# Patient Record
Sex: Male | Born: 1960 | Race: White | Hispanic: No | Marital: Single | Smoking: Former smoker
Health system: Southern US, Community
[De-identification: ages and names within clinical notes are randomized; demographics above are authoritative.]

## PROBLEM LIST (undated history)

## (undated) DIAGNOSIS — F1020 Alcohol dependence, uncomplicated: Secondary | ICD-10-CM

## (undated) DIAGNOSIS — S82899A Other fracture of unspecified lower leg, initial encounter for closed fracture: Secondary | ICD-10-CM

## (undated) DIAGNOSIS — K219 Gastro-esophageal reflux disease without esophagitis: Secondary | ICD-10-CM

## (undated) DIAGNOSIS — K4090 Unilateral inguinal hernia, without obstruction or gangrene, not specified as recurrent: Secondary | ICD-10-CM

## (undated) DIAGNOSIS — K562 Volvulus: Secondary | ICD-10-CM

## (undated) HISTORY — DX: Other fracture of unspecified lower leg, initial encounter for closed fracture: S82.899A

## (undated) HISTORY — PX: APPENDECTOMY: SHX54

## (undated) HISTORY — PX: COLON SURGERY: SHX602

## (undated) HISTORY — PX: ANKLE SURGERY: SHX546

---

## 2011-08-04 ENCOUNTER — Emergency Department: Payer: Self-pay | Admitting: Emergency Medicine

## 2012-09-21 IMAGING — CR DG CHEST 2V
1 series · 3 of 3 positions shown · non-contrast
Comparison: none

REASON FOR EXAM: right sided chest and rib pain
COMMENTS:   May transport without cardiac monitor

[Series 1: w chest pa · 0.14mm/px · 3 of 3 slices shown]
[im 1/3]
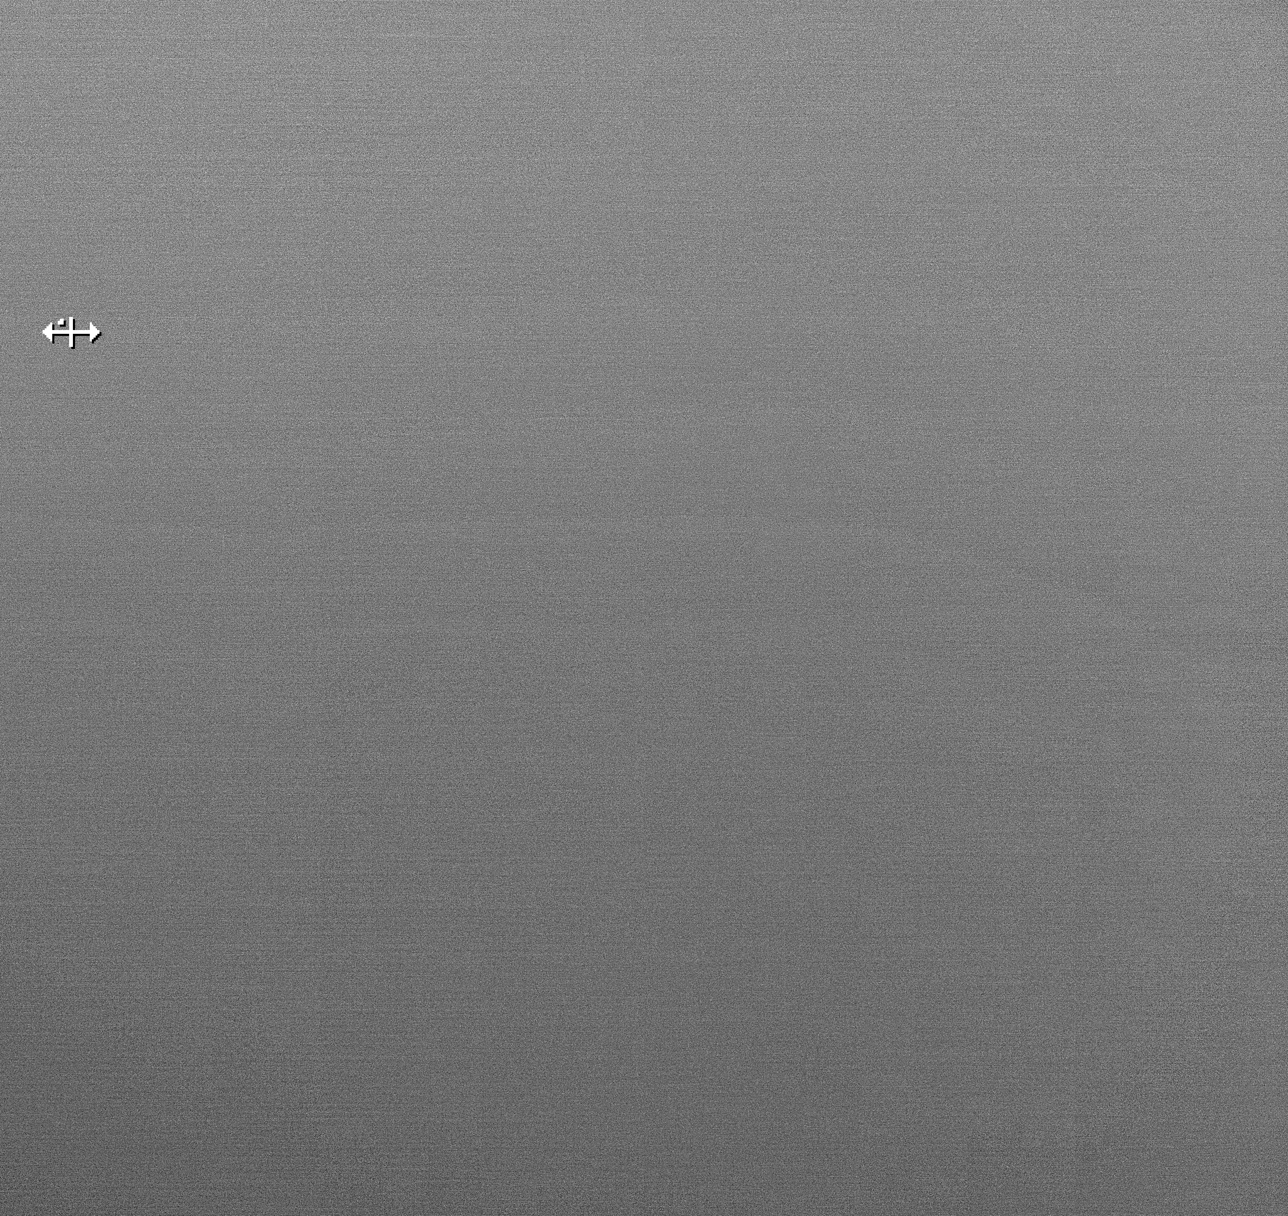
[im 2/3]
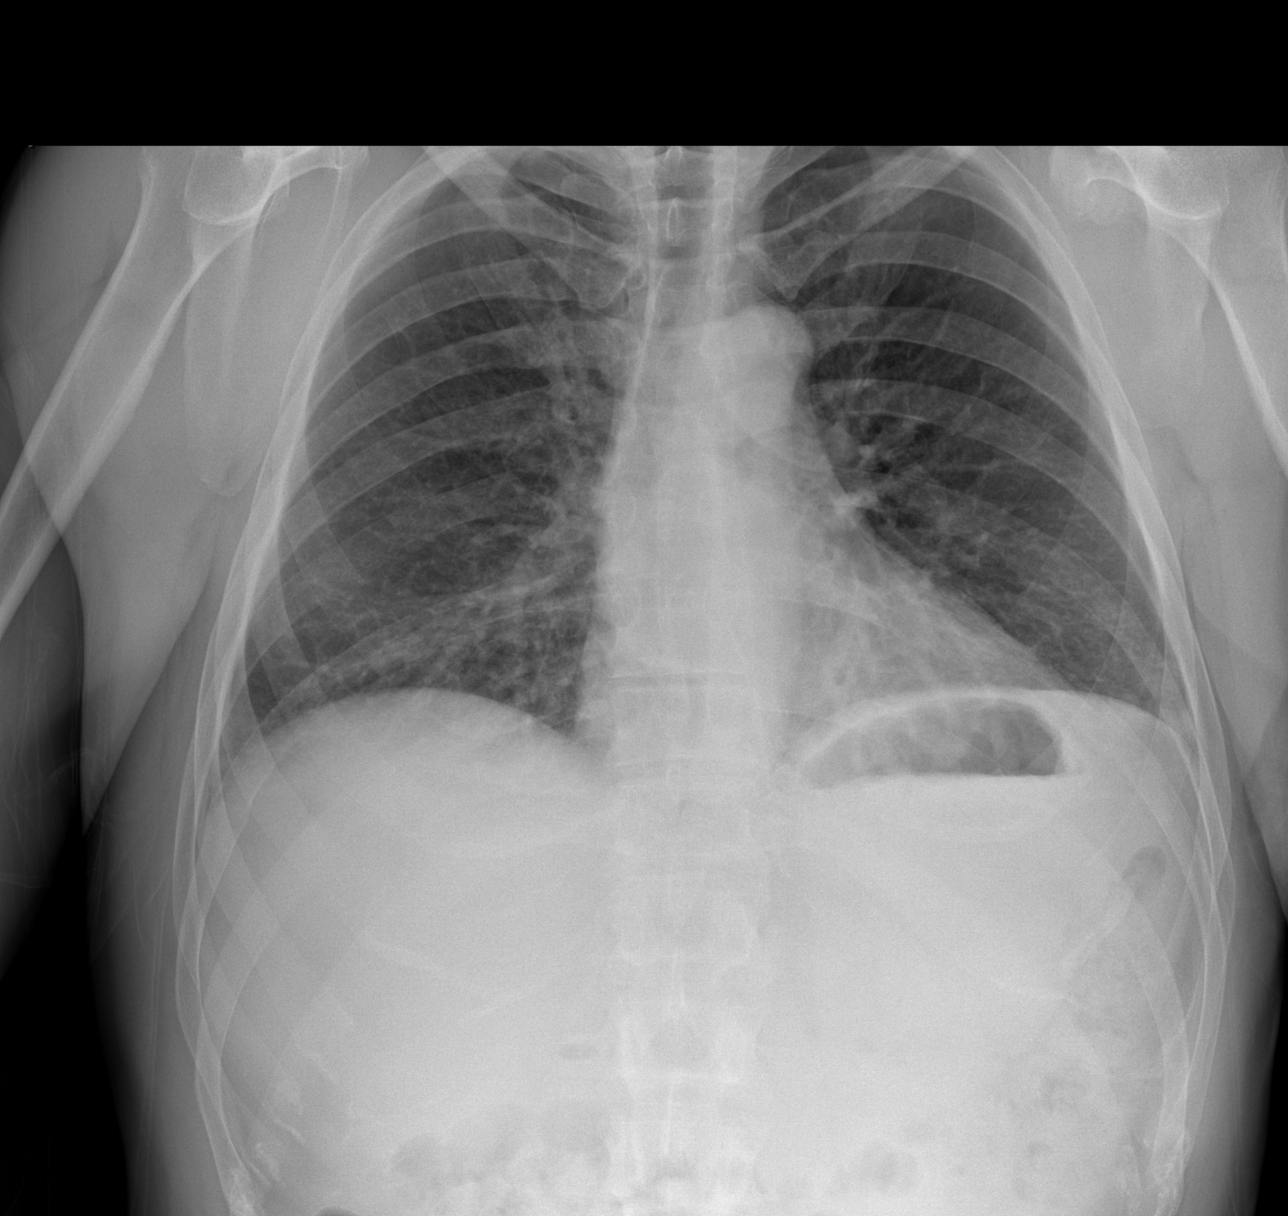
[im 3/3]
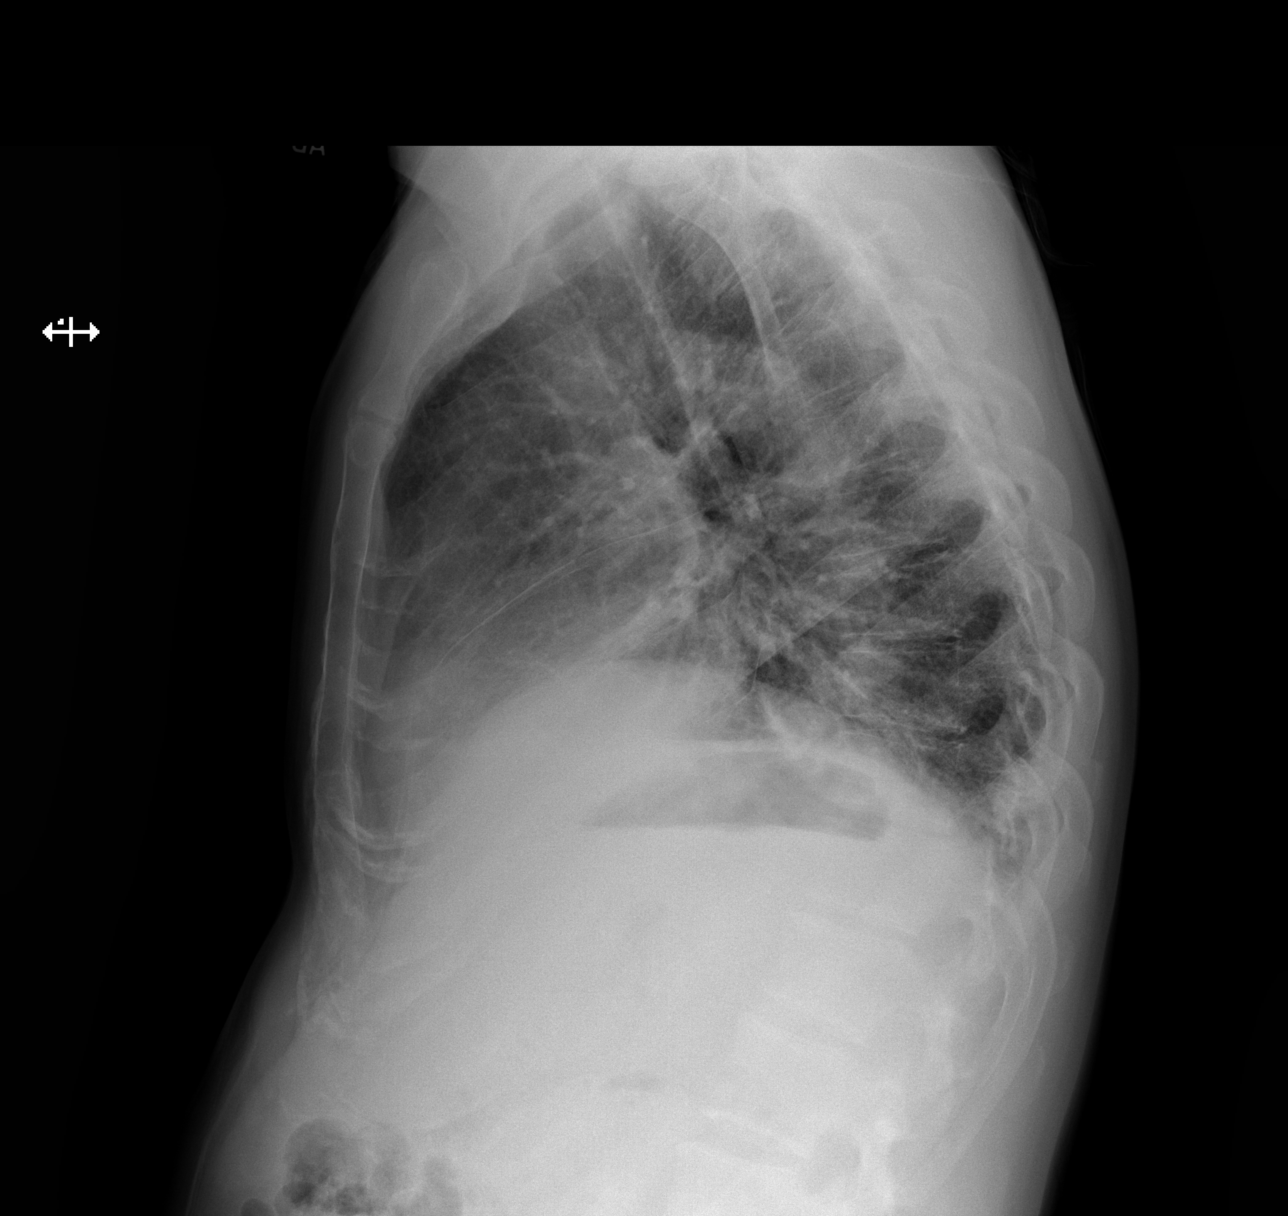

[3 of 3 positions shown; findings below may reference images not displayed]

PROCEDURE:     DXR - DXR CHEST PA (OR AP) AND LATERAL  - August 04, 2011 [DATE]

RESULT:     There is thickening of the lung markings bilaterally compatible
with the lack of the inspiration. Early interstitial pneumonia could produce
similar findings. Followup is recommended if such is clinically indicated.
The heart, mediastinal and osseous structures show no significant
abnormalities.
IMPRESSION: There is nonspecific thickening of the lung markings bilaterally, more
prominent on the right. The findings are likely due to the lack of deep
inspiration but early interstitial pneumonia cannot be totally excluded and
followup is recommended if clinically indicated.

## 2016-08-27 ENCOUNTER — Encounter (INDEPENDENT_AMBULATORY_CARE_PROVIDER_SITE_OTHER): Payer: Self-pay

## 2016-08-27 ENCOUNTER — Ambulatory Visit (INDEPENDENT_AMBULATORY_CARE_PROVIDER_SITE_OTHER): Payer: Managed Care, Other (non HMO) | Admitting: Internal Medicine

## 2016-08-27 ENCOUNTER — Encounter: Payer: Self-pay | Admitting: Internal Medicine

## 2016-08-27 VITALS — BP 132/80 | HR 77 | Ht 71.0 in | Wt 185.0 lb

## 2016-08-27 DIAGNOSIS — R21 Rash and other nonspecific skin eruption: Secondary | ICD-10-CM | POA: Diagnosis not present

## 2016-08-27 DIAGNOSIS — Z136 Encounter for screening for cardiovascular disorders: Secondary | ICD-10-CM

## 2016-08-27 DIAGNOSIS — Z79899 Other long term (current) drug therapy: Secondary | ICD-10-CM

## 2016-08-27 DIAGNOSIS — Z13220 Encounter for screening for lipoid disorders: Secondary | ICD-10-CM

## 2016-08-27 DIAGNOSIS — Z131 Encounter for screening for diabetes mellitus: Secondary | ICD-10-CM

## 2016-08-27 MED ORDER — TRIAMCINOLONE ACETONIDE 0.1 % EX OINT: 1.0000 "application " | TOPICAL_OINTMENT | Freq: Two times a day (BID) | CUTANEOUS | 0 refills | Status: AC

## 2016-08-27 NOTE — Patient Instructions (Addendum)
Medication Instructions:  Your physician has recommended you make the following change in your medication:  1- APPLY Triamcinolone ointment 0.1% twice a day to left lower leg for 1 week.   Labwork: Your physician recommends that you return for lab work in: TODAY (CMP, Lipid, Hemoglobin a1c).   Testing/Procedures: Your physician has requested that you have an abdominal aorta duplex. During this test, an ultrasound is used to evaluate the aorta. Allow 30 minutes for this exam. Do not eat after midnight the day before and avoid carbonated beverages   Follow-Up: Your physician recommends that you schedule a follow-up appointment in: 3 MONTHS WITH DR END.   You have been referred to DERMATOLOGY FOR EVALUATION OF RASH. Appointment scheduled with Dr Gwen PoundsKowalski on 10/13/16 at 0900. - Please arrive 15 minutes early to fill out paperwork. - Please bring your insurance card.    Dr End recommends you get knee high compression stockings 15-20 mmHg compression. You can find these over the counter at most drug stores or you can go to the Medical supply store of your choice.

## 2016-08-27 NOTE — Progress Notes (Signed)
New Outpatient Visit Date: 08/27/2016  Primary Care Provider: None  Chief Complaint: Left leg rash and swelling  HPI:  [name] is a 56 y.o. year-old male with no significant past medical history (though he has not been seen by a physician for man years), who presents for evaluation of a rash with swelling around the left ankle and cough.  He is accompanied by his daughter Anthony Maynard, who is a Engineer, site in our office.  [name] reports an itchy, scaly rash along the left calf for over a year.  It is not painful and bothers him most in the winter.  At time, he will develop small abrasions/bleeding with scratching.  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  After recurrent scratching of the rash, he also notes swelling of the left calf and ankle.  He notes prominent veins in both ankles, which have been present for some time.  He is also concerned about a red, scaly area along the outside of his right ear and on his scalp.  He has not seen a primary care physician or dermatologist.  He has applied moisturizing lotion and Neosporin to the left leg rash without significant improvement.  [name]'s daughter is also concerned about the possibility of a AAA, since the patient's father died with complication from a AAA repair.  [name] denies abdominal and back pain but endorses occasional cramps in his legs (especially at night).  He has a history of tobacco use but stopped about 5 years ago.  He does not have other establish cardiovascular disease.  He was evaluated in 2013 for atypical chest pain with EKG and CXR.  The pain was sharp and located along the lower part of his ribs/chest.  It has not recurred since that time.  He denies shortness of breath, palpitations, lightheadedness, orthopnea, and PND.  He does not sleep particularly well at night, though, nor does he feel  well-rested in the morning.  --------------------------------------------------------------------------------------------------  Cardiovascular History & Procedures: Cardiovascular Problems:  Atypical chest pain  Leg craps  Risk Factors:  Male gender, age > 29, and history of tobacco use  Cath/PCI:  None  CV Surgery:  None  EP Procedures and Devices:  None  Non-Invasive Evaluation(s):  None  Recent CV Pertinent Labs: Lab Results  Component Value Date   K 4.1 08/27/2016   BUN 8 08/27/2016   CREATININE 0.78 08/27/2016    --------------------------------------------------------------------------------------------------  Past Medical History:  Diagnosis Date  . Ankle fracture    Right    Past Surgical History:  Procedure Laterality Date  . ANKLE SURGERY     right ankle     Outpatient Encounter Prescriptions as of 08/27/2016  Medication Sig  . b complex vitamins tablet Take 1 tablet by mouth daily.  Marland Kitchen triamcinolone ointment (KENALOG) 0.1 % Apply 1 application topically 2 (two) times daily. To left lower extremity rash, for 1 week.   No facility-administered encounter medications on file as of 08/27/2016.     Allergies: Patient has no known allergies.  Social History   Social History  . Marital status: Single    Spouse name: N/A  . Number of children: N/A  . Years of education: N/A   Occupational History  . Not on file.   Social History Main Topics  . Smoking status: Former Smoker  Packs/day: 1.00    Years: 15.00    Types: Cigarettes    Quit date: 2013  . Smokeless tobacco: Never Used  . Alcohol use 10.8 oz/week    18 Cans of beer per week  . Drug use: No  . Sexual activity: Not on file   Other Topics Concern  . Not on file   Social History Narrative  . No narrative on file    Family History  Problem Relation Age of Onset  . Aneurysm Mother 2066    Brain aneurysm   . Chronic fatigue Mother   . AAA (abdominal aortic aneurysm)  Father 168  . Lung cancer Father     Review of Systems: A 12-system review of systems was performed and was negative except as noted in the HPI.  --------------------------------------------------------------------------------------------------  Physical Exam: BP 132/80 (BP Location: Right Arm, Patient Position: Sitting, Cuff Size: Normal)   Pulse 77   Ht 5\' 11"  (1.803 m)   Wt 185 lb (83.9 kg)   BMI 25.80 kg/m   General:  Well-developed, well-nourished man, seated comfortable on the exam table.  He is accompanied by his daughter. HEENT: No conjunctival pallor or scleral icterus.  Moist mucous membranes.  OP clear. Neck: Supple without lymphadenopathy, thyromegaly, JVD, or HJR.  No carotid bruit. Lungs: Normal work of breathing.  Clear to auscultation bilaterally without wheezes or crackles. Heart: Regular rate and rhythm without murmurs, rubs, or gallops.  Non-displaced PMI. Abd: Bowel sounds present.  Soft, NT/ND without hepatosplenomegaly Ext: Trace calf and 1+ ankle edema (posterior to the medial malleoli) bilaterally.  Radial, PT, and DP pulses are 2+ bilaterally Skin: Left lateral and anterior calf demonstrates scaly, erythematous rash with areas of excoriation.  Right ear helix with small, red but somewhat pearly papule.  Scaling noted along scalp with excoriations. Neuro: CNIII-XII intact.  Strength and fine-touch sensation intact in upper and lower extremities bilaterally. Psych: Normal mood and affect.  EKG:  Normal sinus rhythm without significant abnormalities.  No significant change compared with prior tracing from 08/04/11 (I have personally reviewed both tracings).   Lab Results  Component Value Date   NA 140 08/27/2016   K 4.1 08/27/2016   CL 95 (L) 08/27/2016   CO2 25 08/27/2016   BUN 8 08/27/2016   CREATININE 0.78 08/27/2016   GLUCOSE 94 08/27/2016   ALT 24 08/27/2016     --------------------------------------------------------------------------------------------------  ASSESSMENT AND PLAN: Left leg rash Findings are most suggestive of venous stasis dermatitis, though the lateral calf location is somewhat atypical.  Pedal pulses are brisk without foot ulcers; I therefore have a low suspicion for arterial insufficiency.  We will try triamcinolone 0.1% ointment applied to the left leg BID x 1 week.  I will also have the patient wear compression stockings to help with venous insufficiency.  We will refer him to dermatology for further evaluation.  Right ear rash Entire ear is somewhat red, though a small, somewhat pearly papule is noticeable.  This raises the possibility of basal cell carcinoma.  I have referred the patient to dermatology for further evaluation.  Screening for AAA Patient has a history of tobacco use and family history of AAA.  He has occasional LE cramps, which are non-specific.  His pedal pulses are brisk.  However, given his risk factors, we will obtain an ultrasound for further evaluation of the aorta.  Cardiovascular screening Given the patient's age and lack of medical care for many years, we will obtain routine, labs including CMP,  hemoglobin A1c, and lipid panel.  He will need to establish with a PCP as well for continued primary care needs.  Follow-up: Return to clinic in 3 months.  Yvonne Kendall, MD 08/28/2016 7:31 PM

## 2016-08-28 ENCOUNTER — Encounter: Payer: Self-pay | Admitting: Internal Medicine

## 2016-08-28 ENCOUNTER — Telehealth: Payer: Self-pay | Admitting: *Deleted

## 2016-08-28 LAB — COMPREHENSIVE METABOLIC PANEL
ALK PHOS: 92 IU/L (ref 39–117)
ALT: 24 IU/L (ref 0–44)
AST: 25 IU/L (ref 0–40)
Albumin/Globulin Ratio: 1.9 (ref 1.2–2.2)
Albumin: 4.7 g/dL (ref 3.5–5.5)
BILIRUBIN TOTAL: 1.2 mg/dL (ref 0.0–1.2)
BUN/Creatinine Ratio: 10 (ref 9–20)
BUN: 8 mg/dL (ref 6–24)
CHLORIDE: 95 mmol/L — AB (ref 96–106)
CO2: 25 mmol/L (ref 18–29)
Calcium: 9.7 mg/dL (ref 8.7–10.2)
Creatinine, Ser: 0.78 mg/dL (ref 0.76–1.27)
GFR calc Af Amer: 117 mL/min/{1.73_m2} (ref >59)
GFR calc non Af Amer: 102 mL/min/{1.73_m2} (ref >59)
Globulin, Total: 2.5 g/dL (ref 1.5–4.5)
Glucose: 94 mg/dL (ref 65–99)
Potassium: 4.1 mmol/L (ref 3.5–5.2)
Sodium: 140 mmol/L (ref 134–144)
Total Protein: 7.2 g/dL (ref 6.0–8.5)

## 2016-08-28 LAB — HEMOGLOBIN A1C
Est. average glucose Bld gHb Est-mCnc: 100 mg/dL
HEMOGLOBIN A1C: 5.1 % (ref 4.8–5.6)

## 2016-08-28 NOTE — Telephone Encounter (Signed)
Patient had lab work drawn at office visit yesterday. Lipid panel was ordered but not processed. S/w [facility] and they said Lipids can be added to blood work. This will process later on today.

## 2016-08-28 NOTE — Telephone Encounter (Signed)
S/w patient's daughter, ok per DPR, concerning referral to dermatology.  Daughter scheduled appt with [facility] [facility] per patient preference in February.  Called [facility] and cancelled patient's appt at that locations. Daughter, [place], notified that appt was cancelled.

## 2016-08-29 NOTE — Telephone Encounter (Signed)
Received written authorization request to add on Lipid panel to patient's lab work. Signed by Onnie BoerJennifer Clark, RN and faxed back to [facility].  Lipid panel had resulted in EPIC.

## 2016-08-30 LAB — LIPID PANEL
CHOLESTEROL TOTAL: 205 mg/dL — AB (ref 100–199)
Chol/HDL Ratio: 2.6 ratio units (ref 0.0–5.0)
HDL: 79 mg/dL (ref >39)
LDL Calculated: 95 mg/dL (ref 0–99)
Triglycerides: 157 mg/dL — ABNORMAL HIGH (ref 0–149)
VLDL CHOLESTEROL CAL: 31 mg/dL (ref 5–40)

## 2016-08-30 LAB — SPECIMEN STATUS REPORT

## 2016-09-08 ENCOUNTER — Other Ambulatory Visit: Payer: Self-pay | Admitting: Internal Medicine

## 2016-09-08 DIAGNOSIS — Z136 Encounter for screening for cardiovascular disorders: Secondary | ICD-10-CM

## 2016-09-08 DIAGNOSIS — Z8249 Family history of ischemic heart disease and other diseases of the circulatory system: Secondary | ICD-10-CM

## 2016-09-22 ENCOUNTER — Ambulatory Visit: Payer: Managed Care, Other (non HMO)

## 2016-09-22 DIAGNOSIS — Z8249 Family history of ischemic heart disease and other diseases of the circulatory system: Secondary | ICD-10-CM

## 2016-09-22 DIAGNOSIS — Z136 Encounter for screening for cardiovascular disorders: Secondary | ICD-10-CM

## 2016-09-22 DIAGNOSIS — I700 Atherosclerosis of aorta: Secondary | ICD-10-CM | POA: Diagnosis not present

## 2016-11-17 ENCOUNTER — Telehealth: Payer: Self-pay | Admitting: *Deleted

## 2016-11-17 NOTE — Telephone Encounter (Signed)
That is fine 

## 2016-11-17 NOTE — Telephone Encounter (Signed)
Patient's daughter listed on DPR stated patient wants to cancel his appt for tomorrow and follow up in 6 months instead of 3 months.  Advised her that Dr Serita Kyle last OV stated for patient to f/u in 3 months and will route to Dr End to advise if a sooner appt is necessary.  Patient is out of town tomorrow as well.  Appt cancelled. Routing to Dr End.

## 2016-11-18 ENCOUNTER — Ambulatory Visit: Payer: Managed Care, Other (non HMO) | Admitting: Internal Medicine

## 2018-12-03 ENCOUNTER — Telehealth: Payer: Self-pay

## 2018-12-03 NOTE — Telephone Encounter (Signed)
Attempt to call patient 3 times per recall list.  No answer. No call back.  Will delete recall.

## 2022-03-18 ENCOUNTER — Emergency Department: Payer: Commercial Managed Care - PPO

## 2022-03-18 ENCOUNTER — Other Ambulatory Visit: Payer: Self-pay

## 2022-03-18 ENCOUNTER — Encounter: Payer: Self-pay | Admitting: Emergency Medicine

## 2022-03-18 ENCOUNTER — Emergency Department
Admission: EM | Admit: 2022-03-18 | Discharge: 2022-03-18 | Disposition: A | Discharge status: Home / Self Care | Payer: Commercial Managed Care - PPO | Attending: Emergency Medicine | Admitting: Emergency Medicine

## 2022-03-18 DIAGNOSIS — K4090 Unilateral inguinal hernia, without obstruction or gangrene, not specified as recurrent: Secondary | ICD-10-CM | POA: Diagnosis not present

## 2022-03-18 DIAGNOSIS — R7989 Other specified abnormal findings of blood chemistry: Secondary | ICD-10-CM | POA: Insufficient documentation

## 2022-03-18 DIAGNOSIS — R1903 Right lower quadrant abdominal swelling, mass and lump: Secondary | ICD-10-CM | POA: Diagnosis present

## 2022-03-18 LAB — HEPATIC FUNCTION PANEL
ALT: 56 U/L — ABNORMAL HIGH (ref 0–44)
AST: 62 U/L — ABNORMAL HIGH (ref 15–41)
Albumin: 4.1 g/dL (ref 3.5–5.0)
Alkaline Phosphatase: 74 U/L (ref 38–126)
Bilirubin, Direct: 0.3 mg/dL — ABNORMAL HIGH (ref 0.0–0.2)
Indirect Bilirubin: 0.6 mg/dL (ref 0.3–0.9)
Total Bilirubin: 0.9 mg/dL (ref 0.3–1.2)
Total Protein: 7.2 g/dL (ref 6.5–8.1)

## 2022-03-18 LAB — CBC
HCT: 42.1 % (ref 39.0–52.0)
Hemoglobin: 14.4 g/dL (ref 13.0–17.0)
MCH: 34.6 pg — ABNORMAL HIGH (ref 26.0–34.0)
MCHC: 34.2 g/dL (ref 30.0–36.0)
MCV: 101.2 fL — ABNORMAL HIGH (ref 80.0–100.0)
Platelets: 192 K/uL (ref 150–400)
RBC: 4.16 MIL/uL — ABNORMAL LOW (ref 4.22–5.81)
RDW: 11.9 % (ref 11.5–15.5)
WBC: 7.3 K/uL (ref 4.0–10.5)
nRBC: 0 % (ref 0.0–0.2)

## 2022-03-18 LAB — BASIC METABOLIC PANEL
Anion gap: 12 (ref 5–15)
BUN: 9 mg/dL (ref 8–23)
CO2: 24 mmol/L (ref 22–32)
Calcium: 9.1 mg/dL (ref 8.9–10.3)
Chloride: 97 mmol/L — ABNORMAL LOW (ref 98–111)
Creatinine, Ser: 0.67 mg/dL (ref 0.61–1.24)
GFR, Estimated: >60 mL/min (ref >60)
Glucose, Bld: 156 mg/dL — ABNORMAL HIGH (ref 70–99)
Potassium: 3.8 mmol/L (ref 3.5–5.1)
Sodium: 133 mmol/L — ABNORMAL LOW (ref 135–145)

## 2022-03-18 LAB — LIPASE, BLOOD: Lipase: 59 U/L — ABNORMAL HIGH (ref 11–51)

## 2022-03-18 LAB — LACTIC ACID, PLASMA: Lactic Acid, Venous: 2.2 mmol/L (ref 0.5–1.9)

## 2022-03-18 MED ORDER — MORPHINE SULFATE (PF) 4 MG/ML IV SOLN
4.0000 mg | Freq: Once | INTRAVENOUS | Status: AC
  Administered 2022-03-18: 4 mg via INTRAVENOUS
  Filled 2022-03-18: qty 1

## 2022-03-18 MED ORDER — IOHEXOL 300 MG/ML  SOLN
100.0000 mL | Freq: Once | INTRAMUSCULAR | Status: AC | PRN
  Administered 2022-03-18: 100 mL via INTRAVENOUS

## 2022-03-18 MED ORDER — DIAZEPAM 5 MG PO TABS
10.0000 mg | ORAL_TABLET | Freq: Once | ORAL | Status: AC
  Administered 2022-03-18: 10 mg via ORAL
  Filled 2022-03-18: qty 2

## 2022-03-18 NOTE — Discharge Instructions (Signed)
Return to the ER if you have a hernia has not been able to be pushed back in with ice or develop any fevers vomiting worsening abdominal pain or any other concerns.  Please call surgery to schedule an outpatient appointment for outpatient surgery  IMPRESSION: Large right inguinal hernia is noted which contains a portion of cecum and terminal ileum, and appears to be resulting in at least partial obstruction of more proximal small bowel loops.   Hepatic steatosis.   Sigmoid diverticulosis without inflammation.   Aortic Atherosclerosis (ICD10-I70.0).

## 2022-03-18 NOTE — ED Notes (Signed)
ED Provider at bedside. 

## 2022-03-18 NOTE — ED Provider Notes (Addendum)
5:45 PM re-evaluated pt. I had placed an ice pack over the right groin area when I went to reevaluate the area and it appears that it has reduced.  Given the frequency of this I still recommended discussing with surgery.  Dr. Tonna Boehringer is currently in surgery but will come down to evaluate patient after surgery.   8:21 PM  he was seen by surgery.  Considered admission but given patient's hernia has reduced and patient has been seen by surgery and is remained reduced patient has resolution of symptoms surgery with prefer to do this outpatient is recommending discharge.  I reevaluated patient and he agrees that he would want other it be done outpatient.  He continues to have no pain.  I recommended a repeat lactate to make sure there is no evidence of bowel damage and the risk for intestinal necrosis and death but patient does not want to wait any longer and is declining repeat lactate.  He understands to return if his symptoms are worsening or develops any other new concerns or if he has recurrence of herniation that he is unable to reduce.  He expressed understanding and wife is there to witness this conversation but at this time he did not want to stay for any additional blood work.  He has capacity to make this decision at this time is electing to be discharged home  Patient noted to have some hepatic steatosis on his CT scan and slight elevation of his liver test.  Recommended patient cut down on his alcohol use but we discussed the dangers of complete cessation.  He expressed understanding.  I discussed the provisional nature of ED diagnosis, the treatment so far, the ongoing plan of care, follow up appointments and return precautions with the patient and any family or support people present. They expressed understanding and agreed with the plan, discharged home.      Concha Se, MD 03/18/22 2023    Concha Se, MD 03/18/22 2024

## 2022-03-18 NOTE — ED Notes (Signed)
Lab made aware of add-on labwork.

## 2022-03-18 NOTE — ED Triage Notes (Signed)
Pt via POV from home. Pt has a umbilical hernia that started in umbilicus and down to his his RLQ, states it is soft but painful, states he has been pushing bag in and he could not push it back in this time. Pt is A&Ox4 and NAD.

## 2022-03-18 NOTE — ED Notes (Signed)
Ice pack placed on hernia by [employer].

## 2022-03-18 NOTE — ED Provider Notes (Signed)
[facility] Provider Note    Event Date/Time   First MD Initiated Contact with Patient 03/18/22 1439     (approximate)   History   Hernia   HPI  Anthony Maynard is a 61 y.o. male   Past medical history of no past medical history, though the patient states that he does not visit doctors. Presents with mass in the right groin that is extremely painful. He states that he has had an umbilical hernia that is reducible and has been stable for many years.  Over the last several weeks he noticed a bulge that slips into his right groin that is reducible but this morning it became firm severely tender and irreducible.  His last bowel movement was a small 1 earlier this morning.  He has no nausea or vomiting.  No other complaints.  History was obtained via the patient and his partner who is at bedside.      Physical Exam   Triage Vital Signs: ED Triage Vitals  Enc Vitals Group     BP 03/18/22 1311 129/75     Pulse Rate 03/18/22 1311 (!) 20     Resp 03/18/22 1311 18     Temp 03/18/22 1311 98.2 F (36.8 C)     Temp src --      SpO2 03/18/22 1311 98 %     Weight 03/18/22 1518 150 lb (68 kg)     Height 03/18/22 1308 5\' 11"  (1.803 m)     Head Circumference --      Peak Flow --      Pain Score 03/18/22 1308 10     Pain Loc --      Pain Edu? --      Excl. in GC? --     Most recent vital signs: Vitals:   03/18/22 1311 03/18/22 1510  BP: 129/75 134/82  Pulse: (!) 20 78  Resp: 18 18  Temp: 98.2 F (36.8 C)   SpO2: 98% 100%    General: Awake, alert, appears to be in pain. CV:  Good peripheral perfusion.  Resp:  Normal effort.  Breath sounds clear. Abd:  No distention.  Nontender to palpation except for a firm irreducible mass in the right inguinal area, no overlying skin changes.  He does have an umbilical hernia that is reducible and nontender. Other:  No signs of acute alcohol withdrawal, no obvious tremors or tongue fasciculation, hemodynamics  no tachycardia not hypertensive.   ED Results / Procedures / Treatments   Labs (all labs ordered are listed, but only abnormal results are displayed) Labs Reviewed  CBC - Abnormal; Notable for the following components:      Result Value   RBC 4.16 (*)    MCV 101.2 (*)    MCH 34.6 (*)    All other components within normal limits  BASIC METABOLIC PANEL - Abnormal; Notable for the following components:   Sodium 133 (*)    Chloride 97 (*)    Glucose, Bld 156 (*)    All other components within normal limits  HEPATIC FUNCTION PANEL - Abnormal; Notable for the following components:   AST 62 (*)    ALT 56 (*)    Bilirubin, Direct 0.3 (*)    All other components within normal limits  LIPASE, BLOOD - Abnormal; Notable for the following components:   Lipase 59 (*)    All other components within normal limits  LACTIC ACID, PLASMA - Abnormal; Notable for the following  components:   Lactic Acid, Venous 2.2 (*)    All other components within normal limits  LACTIC ACID, PLASMA     I reviewed labs and they are notable for no leukocytosis.  Mildly elevated lactic at 2.2.  Mildly elevated LFTs.     RADIOLOGY I dependently reviewed and interpreted CT scan of the abdomen and pelvis pending by the time of my signout in this note.   PROCEDURES:  Critical Care performed: No  Procedures   MEDICATIONS ORDERED IN ED: Medications  morphine (PF) 4 MG/ML injection 4 mg (4 mg Intravenous Given 03/18/22 1510)  iohexol (OMNIPAQUE) 300 MG/ML solution 100 mL (100 mLs Intravenous Contrast Given 03/18/22 1527)  diazepam (VALIUM) tablet 10 mg (10 mg Oral Given 03/18/22 1545)    IMPRESSION / MDM / ASSESSMENT AND PLAN / ED COURSE  I reviewed the triage vital signs and the nursing notes.                              Differential diagnosis includes, but is not limited to, strangulated/incarcerated hernia, obstruction.  Considered other causes of groin pain / abdominal pain like appendicitis, urine  infection, ischemia but less likely in this patient who has obvious mass in the area of pain.   MDM: This patient with a inguinal hernia to the right side that is firm and irreducible concerning for incarcerated hernia.  Getting CT scan, labs, pain control.  The patient's spouse notes that this patient drinks daily.  There is no signs of alcohol withdrawal at this time.  Given 10 mg of Valium anxiety, continue to monitor for signs of alcohol withdrawal.   Patient's presentation is most consistent with acute presentation with potential threat to life or bodily function.       FINAL CLINICAL IMPRESSION(S) / ED DIAGNOSES   Final diagnoses:  Inguinal hernia, right     Rx / DC Orders   ED Discharge Orders     None        Note:  This document was prepared using Dragon voice recognition software and may include unintentional dictation errors.    Pilar Jarvis, MD 03/18/22 [phone]

## 2022-03-18 NOTE — Consult Note (Signed)
Subjective:   CC: Right groin hernia  HPI:  Anthony Maynard is a 61 y.o. male who was referred by [facility] for evaluation of above cc.   Symptoms were first noted  today.   Pain was severe until hernia spontaneously reduced.  Associated with nothing specific, exacerbated by exertion/ lump is reducible.     Past Medical History:  has a past medical history of Ankle fracture.  Past Surgical History:  Past Surgical History:  Procedure Laterality Date   ANKLE SURGERY     right ankle     Family History: family history includes AAA (abdominal aortic aneurysm) (age of onset: 84) in his father; Aneurysm (age of onset: 74) in his mother; Chronic fatigue in his mother; Lung cancer in his father.  Social History:  reports that he quit smoking about 10 years ago. His smoking use included cigarettes. He has a 15.00 pack-year smoking history. He has never used smokeless tobacco. He reports current alcohol use of about 18.0 standard drinks of alcohol per week. He reports that he does not use drugs.  Current Medications:  Prior to Admission medications   Medication Sig Start Date End Date Taking? Authorizing Provider  b complex vitamins tablet Take 1 tablet by mouth daily.    [provider]    Allergies:  Allergies as of 03/18/2022   (No Known Allergies)    ROS:  General: Denies weight loss, weight gain, fatigue, fevers, chills, and night sweats. Eyes: Denies blurry vision, double vision, eye pain, itchy eyes, and tearing. Ears: Denies hearing loss, earache, and ringing in ears. Nose: Denies sinus pain, congestion, infections, runny nose, and nosebleeds. Mouth/throat: Denies hoarseness, sore throat, bleeding gums, and difficulty swallowing. Heart: Denies chest pain, palpitations, racing heart, irregular heartbeat, leg pain or swelling, and decreased activity tolerance. Respiratory: Denies breathing difficulty, shortness of breath, wheezing, cough, and sputum. GI: Denies change in  appetite, heartburn, nausea, vomiting, constipation, diarrhea, and blood in stool. GU: Denies difficulty urinating, pain with urinating, urgency, frequency, blood in urine. Musculoskeletal: Denies joint stiffness, pain, swelling, muscle weakness. Skin: Denies rash, itching, mass, tumors, sores, and boils Neurologic: Denies headache, fainting, dizziness, seizures, numbness, and tingling. Psychiatric: Denies depression, anxiety, difficulty sleeping, and memory loss. Endocrine: Denies heat or cold intolerance, and increased thirst or urination. Blood/lymph: Denies easy bruising, easy bruising, and swollen glands     Objective:     BP (!) 140/87   Pulse 73   Temp 98.4 F (36.9 C) (Oral)   Resp 12   Ht 5\' 11"  (1.803 m)   Wt 68 kg   SpO2 97%   BMI 20.92 kg/m   Constitutional :  alert, cooperative, appears stated age, and no distress  Lymphatics/Throat:  no asymmetry, masses, or scars  Respiratory:  clear to auscultation bilaterally  Cardiovascular:  regular rate and rhythm  Gastrointestinal: soft, non-tender; bowel sounds normal; no masses,  no organomegaly.  Right inguinal hernia defect palpable with no hernia contents at this time.  Umbilical hernia also noted.  Musculoskeletal: lying in bed, no obvious difficulty moving upper extremities  Skin: Cool and moist  Psychiatric: Normal affect, non-agitated, not confused       LABS:     Latest Ref Rng & Units 03/18/2022    1:09 PM 08/27/2016    3:44 PM  CMP  Glucose 70 - 99 mg/dL 08/29/2016  94   BUN 8 - 23 mg/dL 9  8   Creatinine 161 - 1.24 mg/dL 0.96  0.45  Sodium 135 - 145 mmol/L 133  140   Potassium 3.5 - 5.1 mmol/L 3.8  4.1   Chloride 98 - 111 mmol/L 97  95   CO2 22 - 32 mmol/L 24  25   Calcium 8.9 - 10.3 mg/dL 9.1  9.7   Total Protein 6.5 - 8.1 g/dL 7.2  7.2   Total Bilirubin 0.3 - 1.2 mg/dL 0.9  1.2   Alkaline Phos 38 - 126 U/L 74  92   AST 15 - 41 U/L 62  25   ALT 0 - 44 U/L 56  24       Latest Ref Rng & Units 03/18/2022     1:09 PM  CBC  WBC 4.0 - 10.5 K/uL 7.3   Hemoglobin 13.0 - 17.0 g/dL 25.0   Hematocrit 03.7 - 52.0 % 42.1   Platelets 150 - 400 K/uL 192     RADS: N/a Assessment:     Right inguinal hernia Plan:    Discussed the risk of surgery including recurrence, which can be up to 50% in the case of incisional or complex hernias, possible use of prosthetic materials (mesh) and the increased risk of mesh infxn if used, bleeding, chronic pain, post-op infxn, post-op SBO or ileus, and possible re-operation to address said risks. The risks of general anesthetic, if used, includes MI, CVA, sudden death or even reaction to anesthetic medications also discussed. Alternatives include continued observation.  Benefits include possible symptom relief, prevention of incarceration, strangulation, enlargement in size over time, and the risk of emergency surgery in the face of strangulation.  Typical post-op recovery time of 3-5 days with 4-6 weeks of activity restrictions were also discussed.  The patient verbalized understanding and all questions were answered to the patient's satisfaction.  Due to successful reduction of hernia contents higher to consultation, will schedule elective hernia repair.  Robotic assisted laparoscopic.  We will fix the incidental umbilical hernia at the same time.  Strict ED return precautions discussed in case this recurs.   labs/images/medications/previous chart entries reviewed personally and relevant changes/updates noted above.

## 2022-03-21 ENCOUNTER — Ambulatory Visit: Payer: Self-pay | Admitting: Surgery

## 2022-03-21 ENCOUNTER — Encounter
Admission: RE | Admit: 2022-03-21 | Discharge: 2022-03-21 | Disposition: A | Discharge status: Home / Self Care | Payer: 59 | Source: Ambulatory Visit | Attending: Surgery | Admitting: Surgery

## 2022-03-21 HISTORY — DX: Unilateral inguinal hernia, without obstruction or gangrene, not specified as recurrent: K40.90

## 2022-03-21 HISTORY — DX: Alcohol dependence, uncomplicated: F10.20

## 2022-03-21 HISTORY — DX: Volvulus: K56.2

## 2022-03-21 HISTORY — DX: Gastro-esophageal reflux disease without esophagitis: K21.9

## 2022-03-21 NOTE — H&P (Signed)
Subjective:   CC: Right groin hernia  HPI:  Anthony Maynard is a 61 y.o. male who was referred by [facility] for evaluation of above cc.   Symptoms were first noted  today.   Pain was severe until hernia spontaneously reduced.  Associated with nothing specific, exacerbated by exertion/ lump is reducible.     Past Medical History:  has a past medical history of Ankle fracture.  Past Surgical History:  Past Surgical History:  Procedure Laterality Date   ANKLE SURGERY     right ankle     Family History: family history includes AAA (abdominal aortic aneurysm) (age of onset: 68) in his father; Aneurysm (age of onset: 66) in his mother; Chronic fatigue in his mother; Lung cancer in his father.  Social History:  reports that he quit smoking about 10 years ago. His smoking use included cigarettes. He has a 15.00 pack-year smoking history. He has never used smokeless tobacco. He reports current alcohol use of about 18.0 standard drinks of alcohol per week. He reports that he does not use drugs.  Current Medications:  Prior to Admission medications   Medication Sig Start Date End Date Taking? Authorizing Provider  b complex vitamins tablet Take 1 tablet by mouth daily.    [provider]    Allergies:  Allergies as of 03/18/2022   (No Known Allergies)    ROS:  General: Denies weight loss, weight gain, fatigue, fevers, chills, and night sweats. Eyes: Denies blurry vision, double vision, eye pain, itchy eyes, and tearing. Ears: Denies hearing loss, earache, and ringing in ears. Nose: Denies sinus pain, congestion, infections, runny nose, and nosebleeds. Mouth/throat: Denies hoarseness, sore throat, bleeding gums, and difficulty swallowing. Heart: Denies chest pain, palpitations, racing heart, irregular heartbeat, leg pain or swelling, and decreased activity tolerance. Respiratory: Denies breathing difficulty, shortness of breath, wheezing, cough, and sputum. GI: Denies change in  appetite, heartburn, nausea, vomiting, constipation, diarrhea, and blood in stool. GU: Denies difficulty urinating, pain with urinating, urgency, frequency, blood in urine. Musculoskeletal: Denies joint stiffness, pain, swelling, muscle weakness. Skin: Denies rash, itching, mass, tumors, sores, and boils Neurologic: Denies headache, fainting, dizziness, seizures, numbness, and tingling. Psychiatric: Denies depression, anxiety, difficulty sleeping, and memory loss. Endocrine: Denies heat or cold intolerance, and increased thirst or urination. Blood/lymph: Denies easy bruising, easy bruising, and swollen glands     Objective:     BP (!) 140/87   Pulse 73   Temp 98.4 F (36.9 C) (Oral)   Resp 12   Ht 5' 11" (1.803 m)   Wt 68 kg   SpO2 97%   BMI 20.92 kg/m   Constitutional :  alert, cooperative, appears stated age, and no distress  Lymphatics/Throat:  no asymmetry, masses, or scars  Respiratory:  clear to auscultation bilaterally  Cardiovascular:  regular rate and rhythm  Gastrointestinal: soft, non-tender; bowel sounds normal; no masses,  no organomegaly.  Right inguinal hernia defect palpable with no hernia contents at this time.  Umbilical hernia also noted.  Musculoskeletal: lying in bed, no obvious difficulty moving upper extremities  Skin: Cool and moist  Psychiatric: Normal affect, non-agitated, not confused       LABS:     Latest Ref Rng & Units 03/18/2022    1:09 PM 08/27/2016    3:44 PM  CMP  Glucose 70 - 99 mg/dL 156  94   BUN 8 - 23 mg/dL 9  8   Creatinine 0.61 - 1.24 mg/dL 0.67  0.78     Sodium 135 - 145 mmol/L 133  140   Potassium 3.5 - 5.1 mmol/L 3.8  4.1   Chloride 98 - 111 mmol/L 97  95   CO2 22 - 32 mmol/L 24  25   Calcium 8.9 - 10.3 mg/dL 9.1  9.7   Total Protein 6.5 - 8.1 g/dL 7.2  7.2   Total Bilirubin 0.3 - 1.2 mg/dL 0.9  1.2   Alkaline Phos 38 - 126 U/L 74  92   AST 15 - 41 U/L 62  25   ALT 0 - 44 U/L 56  24       Latest Ref Rng & Units 03/18/2022     1:09 PM  CBC  WBC 4.0 - 10.5 K/uL 7.3   Hemoglobin 13.0 - 17.0 g/dL 14.4   Hematocrit 39.0 - 52.0 % 42.1   Platelets 150 - 400 K/uL 192     RADS: N/a Assessment:     Right inguinal hernia Plan:    Discussed the risk of surgery including recurrence, which can be up to 50% in the case of incisional or complex hernias, possible use of prosthetic materials (mesh) and the increased risk of mesh infxn if used, bleeding, chronic pain, post-op infxn, post-op SBO or ileus, and possible re-operation to address said risks. The risks of general anesthetic, if used, includes MI, CVA, sudden death or even reaction to anesthetic medications also discussed. Alternatives include continued observation.  Benefits include possible symptom relief, prevention of incarceration, strangulation, enlargement in size over time, and the risk of emergency surgery in the face of strangulation.  Typical post-op recovery time of 3-5 days with 4-6 weeks of activity restrictions were also discussed.  The patient verbalized understanding and all questions were answered to the patient's satisfaction.  Due to successful reduction of hernia contents higher to consultation, will schedule elective hernia repair.  Robotic assisted laparoscopic.  We will fix the incidental umbilical hernia at the same time.  Strict ED return precautions discussed in case this recurs.   labs/images/medications/previous chart entries reviewed personally and relevant changes/updates noted above.     

## 2022-03-21 NOTE — H&P (View-Only) (Signed)
Subjective:   CC: Right groin hernia  HPI:  Anthony Maynard is a 61 y.o. male who was referred by [facility] for evaluation of above cc.   Symptoms were first noted  today.   Pain was severe until hernia spontaneously reduced.  Associated with nothing specific, exacerbated by exertion/ lump is reducible.     Past Medical History:  has a past medical history of Ankle fracture.  Past Surgical History:  Past Surgical History:  Procedure Laterality Date   ANKLE SURGERY     right ankle     Family History: family history includes AAA (abdominal aortic aneurysm) (age of onset: 68) in his father; Aneurysm (age of onset: 66) in his mother; Chronic fatigue in his mother; Lung cancer in his father.  Social History:  reports that he quit smoking about 10 years ago. His smoking use included cigarettes. He has a 15.00 pack-year smoking history. He has never used smokeless tobacco. He reports current alcohol use of about 18.0 standard drinks of alcohol per week. He reports that he does not use drugs.  Current Medications:  Prior to Admission medications   Medication Sig Start Date End Date Taking? Authorizing Provider  b complex vitamins tablet Take 1 tablet by mouth daily.    [provider]    Allergies:  Allergies as of 03/18/2022   (No Known Allergies)    ROS:  General: Denies weight loss, weight gain, fatigue, fevers, chills, and night sweats. Eyes: Denies blurry vision, double vision, eye pain, itchy eyes, and tearing. Ears: Denies hearing loss, earache, and ringing in ears. Nose: Denies sinus pain, congestion, infections, runny nose, and nosebleeds. Mouth/throat: Denies hoarseness, sore throat, bleeding gums, and difficulty swallowing. Heart: Denies chest pain, palpitations, racing heart, irregular heartbeat, leg pain or swelling, and decreased activity tolerance. Respiratory: Denies breathing difficulty, shortness of breath, wheezing, cough, and sputum. GI: Denies change in  appetite, heartburn, nausea, vomiting, constipation, diarrhea, and blood in stool. GU: Denies difficulty urinating, pain with urinating, urgency, frequency, blood in urine. Musculoskeletal: Denies joint stiffness, pain, swelling, muscle weakness. Skin: Denies rash, itching, mass, tumors, sores, and boils Neurologic: Denies headache, fainting, dizziness, seizures, numbness, and tingling. Psychiatric: Denies depression, anxiety, difficulty sleeping, and memory loss. Endocrine: Denies heat or cold intolerance, and increased thirst or urination. Blood/lymph: Denies easy bruising, easy bruising, and swollen glands     Objective:     BP (!) 140/87   Pulse 73   Temp 98.4 F (36.9 C) (Oral)   Resp 12   Ht 5' 11" (1.803 m)   Wt 68 kg   SpO2 97%   BMI 20.92 kg/m   Constitutional :  alert, cooperative, appears stated age, and no distress  Lymphatics/Throat:  no asymmetry, masses, or scars  Respiratory:  clear to auscultation bilaterally  Cardiovascular:  regular rate and rhythm  Gastrointestinal: soft, non-tender; bowel sounds normal; no masses,  no organomegaly.  Right inguinal hernia defect palpable with no hernia contents at this time.  Umbilical hernia also noted.  Musculoskeletal: lying in bed, no obvious difficulty moving upper extremities  Skin: Cool and moist  Psychiatric: Normal affect, non-agitated, not confused       LABS:     Latest Ref Rng & Units 03/18/2022    1:09 PM 08/27/2016    3:44 PM  CMP  Glucose 70 - 99 mg/dL 156  94   BUN 8 - 23 mg/dL 9  8   Creatinine 0.61 - 1.24 mg/dL 0.67  0.78     Sodium 135 - 145 mmol/L 133  140   Potassium 3.5 - 5.1 mmol/L 3.8  4.1   Chloride 98 - 111 mmol/L 97  95   CO2 22 - 32 mmol/L 24  25   Calcium 8.9 - 10.3 mg/dL 9.1  9.7   Total Protein 6.5 - 8.1 g/dL 7.2  7.2   Total Bilirubin 0.3 - 1.2 mg/dL 0.9  1.2   Alkaline Phos 38 - 126 U/L 74  92   AST 15 - 41 U/L 62  25   ALT 0 - 44 U/L 56  24       Latest Ref Rng & Units 03/18/2022     1:09 PM  CBC  WBC 4.0 - 10.5 K/uL 7.3   Hemoglobin 13.0 - 17.0 g/dL 14.4   Hematocrit 39.0 - 52.0 % 42.1   Platelets 150 - 400 K/uL 192     RADS: N/a Assessment:     Right inguinal hernia Plan:    Discussed the risk of surgery including recurrence, which can be up to 50% in the case of incisional or complex hernias, possible use of prosthetic materials (mesh) and the increased risk of mesh infxn if used, bleeding, chronic pain, post-op infxn, post-op SBO or ileus, and possible re-operation to address said risks. The risks of general anesthetic, if used, includes MI, CVA, sudden death or even reaction to anesthetic medications also discussed. Alternatives include continued observation.  Benefits include possible symptom relief, prevention of incarceration, strangulation, enlargement in size over time, and the risk of emergency surgery in the face of strangulation.  Typical post-op recovery time of 3-5 days with 4-6 weeks of activity restrictions were also discussed.  The patient verbalized understanding and all questions were answered to the patient's satisfaction.  Due to successful reduction of hernia contents higher to consultation, will schedule elective hernia repair.  Robotic assisted laparoscopic.  We will fix the incidental umbilical hernia at the same time.  Strict ED return precautions discussed in case this recurs.   labs/images/medications/previous chart entries reviewed personally and relevant changes/updates noted above.     

## 2022-03-21 NOTE — Patient Instructions (Signed)
Your procedure is scheduled on:03-26-22 Wednesday Report to the Registration Desk on the 1st floor of the [facility].Then proceed to the 2nd floor Surgery Desk To find out your arrival time, please call [phone] between 1PM - 3PM on:03-25-22 Tuesday If your arrival time is 6:00 am, do not arrive prior to that time as the [facility] entrance doors do not open until 6:00 am.  REMEMBER: Instructions that are not followed completely may result in serious medical risk, up to and including death; or upon the discretion of your surgeon and anesthesiologist your surgery may need to be rescheduled.  Do not eat food after midnight the night before surgery.  No gum chewing, lozengers or hard candies.  You may however, drink CLEAR liquids up to 2 hours before you are scheduled to arrive for your surgery. Do not drink anything within 2 hours of your scheduled arrival time.  Clear liquids include: - water  - apple juice without pulp - gatorade (not RED colors) - black coffee or tea (Do NOT add milk or creamers to the coffee or tea) Do NOT drink anything that is not on this list.  Do NOT take any medication the day of surgery  One week prior to surgery: Stop Anti-inflammatories (NSAIDS) such as Advil, Aleve, Ibuprofen, Motrin, Naproxen, Naprosyn and Aspirin based products such as Excedrin, Goodys Powder, BC Powder.You may however, take Tylenol if needed for pain up until the day of surgery.  Stop ANY OVER THE COUNTER supplements/vitamins NOW (03-21-22) until after surgery (Multivitamin and B Complex)  No Alcohol for 24 hours before or after surgery.  No Smoking including e-cigarettes for 24 hours prior to surgery.  No chewable tobacco products for at least 6 hours prior to surgery.  No nicotine patches on the day of surgery.  Do not use any "recreational" drugs for at least a week prior to your surgery.  Please be advised that the combination of cocaine and anesthesia may have negative  outcomes, up to and including death. If you test positive for cocaine, your surgery will be cancelled.  On the morning of surgery brush your teeth with toothpaste and water, you may rinse your mouth with mouthwash if you wish. Do not swallow any toothpaste or mouthwash.  Use CHG Soap as directed on instruction sheet.  Do not wear jewelry, make-up, hairpins, clips or nail polish.  Do not wear lotions, powders, or perfumes.   Do not shave body from the neck down 48 hours prior to surgery just in case you cut yourself which could leave a site for infection.  Also, freshly shaved skin may become irritated if using the CHG soap.  Contact lenses, hearing aids and dentures may not be worn into surgery.  Do not bring valuables to the hospital. [facility] is not responsible for any missing/lost belongings or valuables.   Notify your doctor if there is any change in your medical condition (cold, fever, infection).  Wear comfortable clothing (specific to your surgery type) to the hospital.  After surgery, you can help prevent lung complications by doing breathing exercises.  Take deep breaths and cough every 1-2 hours. Your doctor may order a device called an Incentive Spirometer to help you take deep breaths. When coughing or sneezing, hold a pillow firmly against your incision with both hands. This is called "splinting." Doing this helps protect your incision. It also decreases belly discomfort.  If you are being admitted to the hospital overnight, leave your suitcase in the car. After  surgery it may be brought to your room.  If you are being discharged the day of surgery, you will not be allowed to drive home. You will need a responsible adult (18 years or older) to drive you home and stay with you that night.   If you are taking public transportation, you will need to have a responsible adult (18 years or older) with you. Please confirm with your physician that it is acceptable to use  public transportation.   Please call the Pre-admissions Testing Dept. at [phone] if you have any questions about these instructions.  Surgery Visitation Policy:  Patients undergoing a surgery or procedure may have two family members or support persons with them as long as the person is not COVID-19 positive or experiencing its symptoms.

## 2022-03-25 MED ORDER — ORAL CARE MOUTH RINSE: 15.0000 mL | Freq: Once | OROMUCOSAL | Status: AC

## 2022-03-25 MED ORDER — LACTATED RINGERS IV SOLN: INTRAVENOUS | Status: DC

## 2022-03-25 MED ORDER — FAMOTIDINE 20 MG PO TABS
20.0000 mg | ORAL_TABLET | Freq: Once | ORAL | Status: AC
  Administered 2022-03-26: 20 mg via ORAL

## 2022-03-25 MED ORDER — CHLORHEXIDINE GLUCONATE CLOTH 2 % EX PADS: 6.0000 | MEDICATED_PAD | Freq: Once | CUTANEOUS | Status: DC

## 2022-03-25 MED ORDER — CHLORHEXIDINE GLUCONATE 0.12 % MT SOLN
15.0000 mL | Freq: Once | OROMUCOSAL | Status: AC
  Administered 2022-03-26: 15 mL via OROMUCOSAL

## 2022-03-25 MED ORDER — ACETAMINOPHEN 500 MG PO TABS
1000.0000 mg | ORAL_TABLET | ORAL | Status: AC
  Administered 2022-03-26: 1000 mg via ORAL

## 2022-03-25 MED ORDER — GABAPENTIN 300 MG PO CAPS
300.0000 mg | ORAL_CAPSULE | ORAL | Status: AC
  Administered 2022-03-26: 300 mg via ORAL

## 2022-03-25 MED ORDER — CELECOXIB 200 MG PO CAPS
200.0000 mg | ORAL_CAPSULE | ORAL | Status: AC
  Administered 2022-03-26: 200 mg via ORAL

## 2022-03-25 MED ORDER — CEFAZOLIN SODIUM-DEXTROSE 2-4 GM/100ML-% IV SOLN
2.0000 g | INTRAVENOUS | Status: AC
  Administered 2022-03-26: 2 g via INTRAVENOUS

## 2022-03-26 ENCOUNTER — Encounter: Payer: Self-pay | Admitting: Surgery

## 2022-03-26 ENCOUNTER — Ambulatory Visit
Admission: RE | Admit: 2022-03-26 | Discharge: 2022-03-26 | Disposition: A | Discharge status: Home / Self Care | Payer: Commercial Managed Care - PPO | Attending: Surgery | Admitting: Surgery

## 2022-03-26 ENCOUNTER — Ambulatory Visit: Payer: Commercial Managed Care - PPO | Admitting: Certified Registered"

## 2022-03-26 ENCOUNTER — Other Ambulatory Visit: Payer: Self-pay

## 2022-03-26 ENCOUNTER — Encounter
Admission: RE | Disposition: A | Discharge status: Home / Self Care | Payer: Self-pay | Source: Home / Self Care | Attending: Surgery

## 2022-03-26 DIAGNOSIS — Z87891 Personal history of nicotine dependence: Secondary | ICD-10-CM | POA: Insufficient documentation

## 2022-03-26 DIAGNOSIS — K439 Ventral hernia without obstruction or gangrene: Secondary | ICD-10-CM | POA: Insufficient documentation

## 2022-03-26 DIAGNOSIS — K4090 Unilateral inguinal hernia, without obstruction or gangrene, not specified as recurrent: Secondary | ICD-10-CM | POA: Diagnosis present

## 2022-03-26 DIAGNOSIS — K429 Umbilical hernia without obstruction or gangrene: Secondary | ICD-10-CM | POA: Diagnosis not present

## 2022-03-26 HISTORY — PX: INSERTION OF MESH: SHX5868

## 2022-03-26 SURGERY — HERNIORRHAPHY, INGUINAL, ROBOT-ASSISTED, LAPAROSCOPIC
Anesthesia: General | Site: Inguinal | Laterality: Right

## 2022-03-26 MED ORDER — FENTANYL CITRATE (PF) 100 MCG/2ML IJ SOLN
INTRAMUSCULAR | Status: AC
  Filled 2022-03-26: qty 2

## 2022-03-26 MED ORDER — ACETAMINOPHEN 325 MG PO TABS: 650.0000 mg | ORAL_TABLET | Freq: Three times a day (TID) | ORAL | 0 refills | Status: AC | PRN

## 2022-03-26 MED ORDER — SUGAMMADEX SODIUM 200 MG/2ML IV SOLN
INTRAVENOUS | Status: DC | PRN
  Administered 2022-03-26: 200 mg via INTRAVENOUS

## 2022-03-26 MED ORDER — DEXAMETHASONE SODIUM PHOSPHATE 10 MG/ML IJ SOLN
INTRAMUSCULAR | Status: DC | PRN
  Administered 2022-03-26: 10 mg via INTRAVENOUS

## 2022-03-26 MED ORDER — IBUPROFEN 800 MG PO TABS: 800.0000 mg | ORAL_TABLET | Freq: Three times a day (TID) | ORAL | 0 refills | Status: AC | PRN

## 2022-03-26 MED ORDER — PROPOFOL 10 MG/ML IV BOLUS
INTRAVENOUS | Status: DC | PRN
  Administered 2022-03-26: 150 mg via INTRAVENOUS

## 2022-03-26 MED ORDER — ROCURONIUM BROMIDE 100 MG/10ML IV SOLN
INTRAVENOUS | Status: DC | PRN
  Administered 2022-03-26: 20 mg via INTRAVENOUS
  Administered 2022-03-26: 10 mg via INTRAVENOUS
  Administered 2022-03-26: 60 mg via INTRAVENOUS

## 2022-03-26 MED ORDER — FENTANYL CITRATE (PF) 100 MCG/2ML IJ SOLN
INTRAMUSCULAR | Status: DC | PRN
Start: 2022-03-26 — End: 2022-03-26
  Administered 2022-03-26 (×2): 50 ug via INTRAVENOUS

## 2022-03-26 MED ORDER — OXYCODONE HCL 5 MG PO TABS
5.0000 mg | ORAL_TABLET | Freq: Once | ORAL | Status: AC | PRN
  Administered 2022-03-26: 5 mg via ORAL

## 2022-03-26 MED ORDER — ACETAMINOPHEN 500 MG PO TABS
ORAL_TABLET | ORAL | Status: AC
  Filled 2022-03-26: qty 2

## 2022-03-26 MED ORDER — SUGAMMADEX SODIUM 500 MG/5ML IV SOLN
INTRAVENOUS | Status: AC
  Filled 2022-03-26: qty 5

## 2022-03-26 MED ORDER — FAMOTIDINE 20 MG PO TABS
ORAL_TABLET | ORAL | Status: DC
Start: 2022-03-26 — End: 2022-03-26
  Filled 2022-03-26: qty 1

## 2022-03-26 MED ORDER — BUPIVACAINE-EPINEPHRINE 0.5% -1:200000 IJ SOLN
INTRAMUSCULAR | Status: DC | PRN
  Administered 2022-03-26: 15 mL

## 2022-03-26 MED ORDER — FENTANYL CITRATE (PF) 100 MCG/2ML IJ SOLN
25.0000 ug | INTRAMUSCULAR | Status: DC | PRN
  Administered 2022-03-26: 50 ug via INTRAVENOUS

## 2022-03-26 MED ORDER — CEFAZOLIN SODIUM-DEXTROSE 2-4 GM/100ML-% IV SOLN
INTRAVENOUS | Status: AC
  Filled 2022-03-26: qty 100

## 2022-03-26 MED ORDER — ONDANSETRON HCL 4 MG/2ML IJ SOLN
INTRAMUSCULAR | Status: AC
  Filled 2022-03-26: qty 2

## 2022-03-26 MED ORDER — SEVOFLURANE IN SOLN
RESPIRATORY_TRACT | Status: AC
  Filled 2022-03-26: qty 250

## 2022-03-26 MED ORDER — CELECOXIB 200 MG PO CAPS
ORAL_CAPSULE | ORAL | Status: AC
  Filled 2022-03-26: qty 1

## 2022-03-26 MED ORDER — OXYCODONE HCL 5 MG PO TABS
ORAL_TABLET | ORAL | Status: AC
  Filled 2022-03-26: qty 1

## 2022-03-26 MED ORDER — CHLORHEXIDINE GLUCONATE 0.12 % MT SOLN
OROMUCOSAL | Status: AC
  Filled 2022-03-26: qty 15

## 2022-03-26 MED ORDER — ONDANSETRON HCL 4 MG/2ML IJ SOLN
INTRAMUSCULAR | Status: DC | PRN
  Administered 2022-03-26: 4 mg via INTRAVENOUS

## 2022-03-26 MED ORDER — BUPIVACAINE-EPINEPHRINE (PF) 0.5% -1:200000 IJ SOLN
INTRAMUSCULAR | Status: AC
  Filled 2022-03-26: qty 30

## 2022-03-26 MED ORDER — BUPIVACAINE LIPOSOME 1.3 % IJ SUSP
INTRAMUSCULAR | Status: AC
Start: 2022-03-26 — End: ?
  Filled 2022-03-26: qty 20

## 2022-03-26 MED ORDER — OXYCODONE HCL 5 MG/5ML PO SOLN: 5.0000 mg | Freq: Once | ORAL | Status: AC | PRN

## 2022-03-26 MED ORDER — PROPOFOL 10 MG/ML IV BOLUS
INTRAVENOUS | Status: AC
  Filled 2022-03-26: qty 20

## 2022-03-26 MED ORDER — DOCUSATE SODIUM 100 MG PO CAPS: 100.0000 mg | ORAL_CAPSULE | Freq: Two times a day (BID) | ORAL | 0 refills | Status: AC | PRN

## 2022-03-26 MED ORDER — 0.9 % SODIUM CHLORIDE (POUR BTL) OPTIME
TOPICAL | Status: DC | PRN
  Administered 2022-03-26: 500 mL

## 2022-03-26 MED ORDER — FENTANYL CITRATE (PF) 100 MCG/2ML IJ SOLN
INTRAMUSCULAR | Status: AC
  Administered 2022-03-26: 50 ug via INTRAVENOUS
  Filled 2022-03-26: qty 2

## 2022-03-26 MED ORDER — LIDOCAINE HCL (PF) 2 % IJ SOLN
INTRAMUSCULAR | Status: AC
  Filled 2022-03-26: qty 5

## 2022-03-26 MED ORDER — OXYCODONE-ACETAMINOPHEN 5-325 MG PO TABS: 1.0000 | ORAL_TABLET | Freq: Three times a day (TID) | ORAL | 0 refills | Status: AC | PRN

## 2022-03-26 MED ORDER — GABAPENTIN 300 MG PO CAPS
ORAL_CAPSULE | ORAL | Status: AC
  Filled 2022-03-26: qty 1

## 2022-03-26 MED ORDER — BUPIVACAINE LIPOSOME 1.3 % IJ SUSP
INTRAMUSCULAR | Status: DC | PRN
  Administered 2022-03-26: 20 mL

## 2022-03-26 MED ORDER — DEXAMETHASONE SODIUM PHOSPHATE 10 MG/ML IJ SOLN
INTRAMUSCULAR | Status: AC
  Filled 2022-03-26: qty 1

## 2022-03-26 MED ORDER — LIDOCAINE HCL (CARDIAC) PF 100 MG/5ML IV SOSY
PREFILLED_SYRINGE | INTRAVENOUS | Status: DC | PRN
  Administered 2022-03-26: 100 mg via INTRAVENOUS

## 2022-03-26 MED ORDER — ROCURONIUM BROMIDE 10 MG/ML (PF) SYRINGE
PREFILLED_SYRINGE | INTRAVENOUS | Status: AC
  Filled 2022-03-26: qty 10

## 2022-03-26 SURGICAL SUPPLY — 52 items
BAG PRESSURE INF REUSE 1000 (BAG) IMPLANT
BLADE SURG SZ11 CARB STEEL (BLADE) ×1 IMPLANT
BNDG GAUZE DERMACEA FLUFF 4 (GAUZE/BANDAGES/DRESSINGS) ×1 IMPLANT
COVER TIP SHEARS 8 DVNC (MISCELLANEOUS) ×1 IMPLANT
COVER TIP SHEARS 8MM DA VINCI (MISCELLANEOUS) ×1
COVER WAND RF STERILE (DRAPES) ×1 IMPLANT
DERMABOND ADVANCED (GAUZE/BANDAGES/DRESSINGS) ×1
DERMABOND ADVANCED .7 DNX12 (GAUZE/BANDAGES/DRESSINGS) ×1 IMPLANT
DRAPE ARM DVNC X/XI (DISPOSABLE) ×3 IMPLANT
DRAPE COLUMN DVNC XI (DISPOSABLE) ×1 IMPLANT
DRAPE DA VINCI XI ARM (DISPOSABLE) ×3
DRAPE DA VINCI XI COLUMN (DISPOSABLE) ×1
ELECT CAUTERY BLADE 6.4 (BLADE) IMPLANT
ELECT REM PT RETURN 9FT ADLT (ELECTROSURGICAL) ×1
ELECTRODE REM PT RTRN 9FT ADLT (ELECTROSURGICAL) ×1 IMPLANT
GLOVE BIOGEL PI IND STRL 7.0 (GLOVE) ×2 IMPLANT
GLOVE BIOGEL PI INDICATOR 7.0 (GLOVE) ×2
GLOVE SURG SYN 6.5 ES PF (GLOVE) ×2 IMPLANT
GLOVE SURG SYN 6.5 PF PI (GLOVE) ×2 IMPLANT
GOWN STRL REUS W/ TWL LRG LVL3 (GOWN DISPOSABLE) ×3 IMPLANT
GOWN STRL REUS W/TWL LRG LVL3 (GOWN DISPOSABLE) ×3
GRASPER SUT TROCAR 14GX15 (MISCELLANEOUS) IMPLANT
IRRIGATOR SUCT 8 DISP DVNC XI (IRRIGATION / IRRIGATOR) IMPLANT
IRRIGATOR SUCTION 8MM XI DISP (IRRIGATION / IRRIGATOR)
IV NS 1000ML (IV SOLUTION)
IV NS 1000ML BAXH (IV SOLUTION) IMPLANT
LABEL OR SOLS (LABEL) IMPLANT
MANIFOLD NEPTUNE II (INSTRUMENTS) ×1 IMPLANT
MESH 3DMAX MID 4X6 RT LRG (Mesh General) IMPLANT
NDL INSUFFLATION 14GA 120MM (NEEDLE) ×1 IMPLANT
NEEDLE HYPO 22GX1.5 SAFETY (NEEDLE) ×1 IMPLANT
NEEDLE INSUFFLATION 14GA 120MM (NEEDLE) ×1 IMPLANT
OBTURATOR OPTICAL STANDARD 8MM (TROCAR) ×1
OBTURATOR OPTICAL STND 8 DVNC (TROCAR) ×1
OBTURATOR OPTICALSTD 8 DVNC (TROCAR) ×1 IMPLANT
PACK LAP CHOLECYSTECTOMY (MISCELLANEOUS) ×1 IMPLANT
PENCIL SMOKE EVACUATOR (MISCELLANEOUS) ×1 IMPLANT
SEAL CANN UNIV 5-8 DVNC XI (MISCELLANEOUS) ×3 IMPLANT
SEAL XI 5MM-8MM UNIVERSAL (MISCELLANEOUS) ×3
SET TUBE SMOKE EVAC HIGH FLOW (TUBING) ×1 IMPLANT
SOLUTION ELECTROLUBE (MISCELLANEOUS) ×1 IMPLANT
SUT MNCRL 4-0 (SUTURE) ×1
SUT MNCRL 4-0 27XMFL (SUTURE) ×1
SUT VIC AB 2-0 SH 27 (SUTURE) ×1
SUT VIC AB 2-0 SH 27XBRD (SUTURE) ×1 IMPLANT
SUT VICRYL 0 AB UR-6 (SUTURE) IMPLANT
SUT VLOC 90 6 CV-15 VIOLET (SUTURE) ×2 IMPLANT
SUTURE MNCRL 4-0 27XMF (SUTURE) ×1 IMPLANT
SYR 30ML LL (SYRINGE) ×1 IMPLANT
TAPE TRANSPORE STRL 2 31045 (GAUZE/BANDAGES/DRESSINGS) ×1 IMPLANT
TRAP FLUID SMOKE EVACUATOR (MISCELLANEOUS) ×1 IMPLANT
WATER STERILE IRR 500ML POUR (IV SOLUTION) ×1 IMPLANT

## 2022-03-26 NOTE — Anesthesia Procedure Notes (Signed)
Procedure Name: Intubation Date/Time: 03/26/2022 11:56 AM  Performed by: Morene Crocker, CRNAPre-anesthesia Checklist: Patient identified, Patient being monitored, Timeout performed, Emergency Drugs available and Suction available Patient Re-evaluated:Patient Re-evaluated prior to induction Oxygen Delivery Method: Circle system utilized Preoxygenation: Pre-oxygenation with 100% oxygen Induction Type: IV induction Ventilation: Mask ventilation without difficulty Laryngoscope Size: 3 and McGraph Grade View: Grade I Tube type: Oral Tube size: 7.0 mm Number of attempts: 1 Airway Equipment and Method: Stylet Placement Confirmation: ETT inserted through vocal cords under direct vision, positive ETCO2 and breath sounds checked- equal and bilateral Secured at: 22 cm Tube secured with: Tape Dental Injury: Teeth and Oropharynx as per pre-operative assessment

## 2022-03-26 NOTE — Anesthesia Preprocedure Evaluation (Signed)
Anesthesia Evaluation  Patient identified by MRN, date of birth, ID band Patient awake    Reviewed: Allergy & Precautions, NPO status , Patient's Chart, lab work & pertinent test results  History of Anesthesia Complications Negative for: history of anesthetic complications  Airway Mallampati: III  TM Distance: >3 FB Neck ROM: full    Dental  (+) Poor Dentition, Dental Advidsory Given   Pulmonary neg pulmonary ROS, neg shortness of breath, neg sleep apnea, neg recent URI, Patient abstained from smoking., former smoker,    Pulmonary exam normal        Cardiovascular (-) angina(-) Past MI and (-) CABG negative cardio ROS Normal cardiovascular exam(-) dysrhythmias      Neuro/Psych negative neurological ROS  negative psych ROS   GI/Hepatic negative GI ROS, Neg liver ROS,   Endo/Other  negative endocrine ROS  Renal/GU      Musculoskeletal   Abdominal   Peds  Hematology negative hematology ROS (+)   Anesthesia Other Findings Past Medical History: No date: Alcohol dependence, daily use (HCC) No date: Ankle fracture     Comment:  Right No date: GERD (gastroesophageal reflux disease)     Comment:  occ No date: Inguinal hernia No date: Volvulus of intestine (HCC)     Comment:  age 61  Past Surgical History: No date: ANKLE SURGERY     Comment:  right ankle  No date: APPENDECTOMY     Comment:  age 64-done at the same time as colon surgery No date: COLON SURGERY     Comment:  age 64-done at the same time as appendectomy  BMI    Body Mass Index: 20.92 kg/m      Reproductive/Obstetrics negative OB ROS                             Anesthesia Physical Anesthesia Plan  ASA: 2  Anesthesia Plan: General/Spinal   Post-op Pain Management:    Induction: Intravenous  PONV Risk Score and Plan: 3 and Ondansetron, Dexamethasone, Midazolam and Treatment may vary due to age or medical  condition  Airway Management Planned: Oral ETT  Additional Equipment:   Intra-op Plan:   Post-operative Plan: Extubation in OR  Informed Consent: I have reviewed the patients History and Physical, chart, labs and discussed the procedure including the risks, benefits and alternatives for the proposed anesthesia with the patient or authorized representative who has indicated his/her understanding and acceptance.     Dental Advisory Given  Plan Discussed with: Anesthesiologist, CRNA and Surgeon  Anesthesia Plan Comments: (Patient consented for risks of anesthesia including but not limited to:  - adverse reactions to medications - damage to eyes, teeth, lips or other oral mucosa - nerve damage due to positioning  - sore throat or hoarseness - Damage to heart, brain, nerves, lungs, other parts of body or loss of life  Patient voiced understanding.)        Anesthesia Quick Evaluation

## 2022-03-26 NOTE — Discharge Instructions (Addendum)
Hernia repair, Care After This sheet gives you information about how to care for yourself after your procedure. Your health care provider may also give you more specific instructions. If you have problems or questions, contact your health care provider. What can I expect after the procedure? After your procedure, it is common to have the following: Pain in your abdomen, especially in the incision areas. You will be given medicine to control the pain. Tiredness. This is a normal part of the recovery process. Your energy level will return to normal over the next several weeks. Changes in your bowel movements, such as constipation or needing to go more often. Talk with your health care provider about how to manage this. Follow these instructions at home: Medicines  tylenol and advil as needed for discomfort.  Please alternate between the two every four hours as needed for pain.    Use narcotics, if prescribed, only when tylenol and motrin is not enough to control pain.  325-650mg every 8hrs to max of 3000mg/24hrs (including the 325mg in every norco dose) for the tylenol.    Advil up to 800mg per dose every 8hrs as needed for pain.   PLEASE RECORD NUMBER OF PILLS TAKEN UNTIL NEXT FOLLOW UP APPT.  THIS WILL HELP DETERMINE HOW READY YOU ARE TO BE RELEASED FROM ANY ACTIVITY RESTRICTIONS Do not drive or use heavy machinery while taking prescription pain medicine. Do not drink alcohol while taking prescription pain medicine.  Incision care    Follow instructions from your health care provider about how to take care of your incision areas. Make sure you: Keep your incisions clean and dry. Wash your hands with soap and water before and after applying medicine to the areas, and before and after changing your bandage (dressing). If soap and water are not available, use hand sanitizer. Change your dressing as told by your health care provider. Leave stitches (sutures), skin glue, or adhesive strips in  place. These skin closures may need to stay in place for 2 weeks or longer. If adhesive strip edges start to loosen and curl up, you may trim the loose edges. Do not remove adhesive strips completely unless your health care provider tells you to do that. Do not wear tight clothing over the incisions. Tight clothing may rub and irritate the incision areas, which may cause the incisions to open. Do not take baths, swim, or use a hot tub until your health care provider approves. OK TO SHOWER IN 24HRS.   Check your incision area every day for signs of infection. Check for: More redness, swelling, or pain. More fluid or blood. Warmth. Pus or a bad smell. Activity Avoid lifting anything that is heavier than 10 lb (4.5 kg) for 2 weeks or until your health care provider says it is okay. No pushing/pulling greater than 30lbs You may resume normal activities as told by your health care provider. Ask your health care provider what activities are safe for you. Take rest breaks during the day as needed. Eating and drinking Follow instructions from your health care provider about what you can eat after surgery. To prevent or treat constipation while you are taking prescription pain medicine, your health care provider may recommend that you: Drink enough fluid to keep your urine clear or pale yellow. Take over-the-counter or prescription medicines. Eat foods that are high in fiber, such as fresh fruits and vegetables, whole grains, and beans. Limit foods that are high in fat and processed sugars, such as fried and   sweet foods. General instructions Ask your health care provider when you will need an appointment to get your sutures or staples removed. Keep all follow-up visits as told by your health care provider. This is important. Contact a health care provider if: You have more redness, swelling, or pain around your incisions. You have more fluid or blood coming from the incisions. Your incisions feel  warm to the touch. You have pus or a bad smell coming from your incisions or your dressing. You have a fever. You have an incision that breaks open (edges not staying together) after sutures or staples have been removed. You develop a rash. You have chest pain or difficulty breathing. You have pain or swelling in your legs. You feel light-headed or you faint. Your abdomen swells (becomes distended). You have nausea or vomiting. You have blood in your stool (feces). This information is not intended to replace advice given to you by your health care provider. Make sure you discuss any questions you have with your health care provider. Document Released: 01/31/2005 Document Revised: 04/02/2018 Document Reviewed: 04/14/2016 Elsevier Interactive Patient Education  2019 [employer]   AMBULATORY SURGERY  DISCHARGE INSTRUCTIONS   The drugs that you were given will stay in your system until tomorrow so for the next 24 hours you should not:  Drive an automobile Make any legal decisions Drink any alcoholic beverage   You may resume regular meals tomorrow.  Today it is better to start with liquids and gradually work up to solid foods.  You may eat anything you prefer, but it is better to start with liquids, then soup and crackers, and gradually work up to solid foods.   Please notify your doctor immediately if you have any unusual bleeding, trouble breathing, redness and pain at the surgery site, drainage, fever, or pain not relieved by medication.    Additional Instructions:        Please contact your physician with any problems or Same Day Surgery at [phone], Monday through Friday 6 am to 4 pm, or [place] [facility] [place] Main number at [phone].  

## 2022-03-26 NOTE — Transfer of Care (Signed)
Immediate Anesthesia Transfer of Care Note  Patient: Anthony Maynard  Procedure(s) Performed: XI ROBOTIC ASSISTED INGUINAL HERNIA (Right: Inguinal) INSERTION OF MESH (Right: Inguinal)  Patient Location: PACU  Anesthesia Type:General  Level of Consciousness: drowsy  Airway & Oxygen Therapy: Patient Spontanous Breathing and Patient connected to face mask oxygen  Post-op Assessment: Report given to RN and Post -op Vital signs reviewed and stable  Post vital signs: Reviewed and stable  Last Vitals:  Vitals Value Taken Time  BP 115/73 03/26/22 1407  Temp 36.1 1407  Pulse 76 03/26/22 1409  Resp 11 03/26/22 1409  SpO2 100 % 03/26/22 1409  Vitals shown include unvalidated device data.  Last Pain:  Vitals:   03/26/22 1029  TempSrc: Oral  PainSc: 0-No pain         Complications: No notable events documented.

## 2022-03-26 NOTE — Op Note (Addendum)
Preoperative diagnosis: Right inguinal, initial, reducible and reducible umbilical Hernia.  Postoperative diagnosis: Right inguinal, initial, reducible and reducible umbilical, and reducible ventral Hernia  Procedure: Robotic assisted laparoscopic right inguinal hernia repair with mesh, primary umbilical and ventral hernia repair   Anesthesia: General  Surgeon: Dr. Tonna Boehringer  Wound Classification: Clean  Specimen: none  Complications: None  Estimated Blood Loss: 67mL   Indications:  inguinal and umbilical hernia. Repair was indicated to avoid complications of incarceration, obstruction and pain, and a prosthetic mesh repair was elected.  See H&P for further details.  Findings: 1. Vas Deferens and cord structures identified and preserved 2. Bard 3D max medium weight mesh used for repair 3.  Reducible 62mm umbilical hernia repaired primarily with 0 Vicryl 4. Reducible 1.5cm incidental ventral hernia repaired primarily with 0 Vicryl 5. Adequate hemostasis achieved  Description of procedure: The patient was taken to the operating room. A time-out was completed verifying correct patient, procedure, site, positioning, and implant(s) and/or special equipment prior to beginning this procedure.  Area was prepped and draped in the usual sterile fashion. An incision was marked 20 cm above the pubic tubercle, slightly above the umbilicus.  2 additional port sites to the right and left of the periumbilical site was marked.  Scrotum wrapped in Kerlix roll.  Veress needle inserted at palmer's point.  Saline drop test noted to be positive with gradual increase in pressure after initiation of gas insufflation.  15 mm of pressure was achieved prior to removing the Veress needle and then placing a 8 mm port via the Optiview technique through the left upper quadrant site.  Inspection of the area afterwards noted no injury to the surrounding organs during insertion of the needle and the port.  Inspection of the  midline abdominal wall noted an umbilical hernia as previously noted but also incidental ventral hernia right at the location of the marked periumbilical port site approximately 3 cm cephalad to the umbilical hernia.  8 mm port placed right upper quadrant port site and another 8 mm port placed directly through the ventral hernia defect, both under direct supervision.  Local anesthesia  infused to the preplanned incision sites prior to insertion of the port.  The [employer] platform was then brought into the operative field and docked to the ports.  Right inguinal hernia noted with no bowel involvement.  A peritoneal flap was created approximately 8cm cephalad to the defect by using scissors with electrocautery.  Dissection was carried down towards the pubic tubercle, developing the myopectineal orifice view.  Laterally the flap was carried towards the ASIS.  Large hernia sac was noted, which carefully dissected away from the adjacent tissues to be fully reduced out of hernia cavity.  Force bipolar was used in addition to the usual instruments to facilitate for reduction of this large hernia sac.  Any bleeding was controlled with combination of electrocautery and manual pressure.    After confirming adequate dissection and the peritoneal reflection completely down and away from the cord structures, a Large Bard 3DMax medium weight mesh was placed within the anterior abdominal wall, secured in place using 2-0 Vicryl on an SH needle immediately above the pubic tubercle.  After noting proper placement of the mesh with the peritoneal reflection deep to it, the previously created peritoneal flap was secured back up to the anterior abdominal wall using running 3-0 V-Lock.  Both needles were then removed out of the abdominal cavity, Xi platform undocked from the ports and removed off  of operative field.  exparel infused as ilioinguinal block.  As well as around the umbilical hernia and ventral hernia.  Under direct  visualization, 73mm umbilical hernia closed using PMI device and 0 Vicryl after making a small incision in the skin.  The periumbilical port site that went through the ventral hernia was then removed, skin incision extended in the larger hernia sac was dissected off the surrounding structures prior to reduction.  The 1.5 cm defect was then closed with interrupted 0 Vicryl x4, due to the extremely thin abdominal wall.  Abdomen then desufflated and remaining ports removed. All the skin incisions were then closed with a subcuticular stitch of Monocryl 4-0. Dermabond was applied. The testis was gently pulled down into its anatomic position in the scrotum.  The patient tolerated the procedure well and was taken to the postanesthesia care unit in stable condition. Sponge and instrument count correct at end of procedure.

## 2022-03-26 NOTE — Interval H&P Note (Signed)
No change. OK to proceed.

## 2022-03-27 ENCOUNTER — Encounter: Payer: Self-pay | Admitting: Surgery

## 2022-03-27 NOTE — Anesthesia Postprocedure Evaluation (Signed)
Anesthesia Post Note  Patient: DAWSON ALBERS  Procedure(s) Performed: XI ROBOTIC ASSISTED INGUINAL HERNIA (Right: Inguinal) INSERTION OF MESH (Right: Inguinal)  Patient location during evaluation: PACU Anesthesia Type: General Level of consciousness: awake and alert Pain management: pain level controlled Vital Signs Assessment: post-procedure vital signs reviewed and stable Respiratory status: spontaneous breathing, nonlabored ventilation, respiratory function stable and patient connected to nasal cannula oxygen Cardiovascular status: blood pressure returned to baseline and stable Postop Assessment: no apparent nausea or vomiting Anesthetic complications: no   No notable events documented.   Last Vitals:  Vitals:   03/26/22 1527 03/26/22 1704  BP: 136/77 130/83  Pulse: 83 89  Resp: 17 16  Temp: 36.4 C   SpO2: 99% 99%    Last Pain:  Vitals:   03/26/22 1704  TempSrc:   PainSc: 4                  Stephanie Coup
